# Patient Record
Sex: Male | Born: 1955 | Race: White | Hispanic: No | Smoking: Never smoker
Health system: Southern US, Community
[De-identification: ages and names within clinical notes are randomized; demographics above are authoritative.]

---

## 2010-11-16 ENCOUNTER — Inpatient Hospital Stay (INDEPENDENT_AMBULATORY_CARE_PROVIDER_SITE_OTHER)
Admission: RE | Admit: 2010-11-16 | Discharge: 2010-11-16 | Disposition: A | Discharge status: Home / Self Care | Payer: Self-pay | Source: Ambulatory Visit | Attending: Family Medicine | Admitting: Family Medicine

## 2010-11-16 ENCOUNTER — Ambulatory Visit (INDEPENDENT_AMBULATORY_CARE_PROVIDER_SITE_OTHER): Payer: Self-pay

## 2010-11-16 DIAGNOSIS — R071 Chest pain on breathing: Secondary | ICD-10-CM

## 2015-06-11 ENCOUNTER — Emergency Department (HOSPITAL_COMMUNITY): Payer: BLUE CROSS/BLUE SHIELD

## 2015-06-11 ENCOUNTER — Encounter (HOSPITAL_COMMUNITY): Payer: Self-pay | Admitting: Emergency Medicine

## 2015-06-11 ENCOUNTER — Emergency Department (HOSPITAL_COMMUNITY)
Admission: EM | Admit: 2015-06-11 | Discharge: 2015-06-11 | Disposition: A | Discharge status: Home / Self Care | Payer: BLUE CROSS/BLUE SHIELD | Attending: Emergency Medicine | Admitting: Emergency Medicine

## 2015-06-11 DIAGNOSIS — R1033 Periumbilical pain: Secondary | ICD-10-CM

## 2015-06-11 DIAGNOSIS — R112 Nausea with vomiting, unspecified: Secondary | ICD-10-CM

## 2015-06-11 DIAGNOSIS — R05 Cough: Secondary | ICD-10-CM | POA: Diagnosis present

## 2015-06-11 DIAGNOSIS — Z79899 Other long term (current) drug therapy: Secondary | ICD-10-CM | POA: Diagnosis not present

## 2015-06-11 DIAGNOSIS — J069 Acute upper respiratory infection, unspecified: Secondary | ICD-10-CM

## 2015-06-11 LAB — CBC WITH DIFFERENTIAL/PLATELET
BASOS ABS: 0 10*3/uL (ref 0.0–0.1)
BASOS PCT: 0 %
EOS PCT: 0 %
Eosinophils Absolute: 0 10*3/uL (ref 0.0–0.7)
HCT: 41.3 % (ref 39.0–52.0)
Hemoglobin: 14.3 g/dL (ref 13.0–17.0)
LYMPHS ABS: 0.9 10*3/uL (ref 0.7–4.0)
LYMPHS PCT: 9 %
MCH: 30.4 pg (ref 26.0–34.0)
MCHC: 34.6 g/dL (ref 30.0–36.0)
MCV: 87.9 fL (ref 78.0–100.0)
MONOS PCT: 8 %
Monocytes Absolute: 0.7 10*3/uL (ref 0.1–1.0)
NEUTROS ABS: 8 10*3/uL — AB (ref 1.7–7.7)
NEUTROS PCT: 83 %
PLATELETS: 208 10*3/uL (ref 150–400)
RBC: 4.7 MIL/uL (ref 4.22–5.81)
RDW: 11.8 % (ref 11.5–15.5)
WBC: 9.6 10*3/uL (ref 4.0–10.5)

## 2015-06-11 LAB — URINALYSIS, ROUTINE W REFLEX MICROSCOPIC
Bilirubin Urine: NEGATIVE
GLUCOSE, UA: NEGATIVE mg/dL
HGB URINE DIPSTICK: NEGATIVE
Ketones, ur: 15 mg/dL — AB
LEUKOCYTES UA: NEGATIVE
Nitrite: NEGATIVE
PH: 6 (ref 5.0–8.0)
PROTEIN: NEGATIVE mg/dL
SPECIFIC GRAVITY, URINE: 1.028 (ref 1.005–1.030)

## 2015-06-11 LAB — COMPREHENSIVE METABOLIC PANEL
ALBUMIN: 4.3 g/dL (ref 3.5–5.0)
ALT: 20 U/L (ref 17–63)
AST: 20 U/L (ref 15–41)
Alkaline Phosphatase: 64 U/L (ref 38–126)
Anion gap: 10 (ref 5–15)
BUN: 20 mg/dL (ref 6–20)
CHLORIDE: 100 mmol/L — AB (ref 101–111)
CO2: 28 mmol/L (ref 22–32)
CREATININE: 1.02 mg/dL (ref 0.61–1.24)
Calcium: 9 mg/dL (ref 8.9–10.3)
GFR calc Af Amer: >60 mL/min (ref >60)
GLUCOSE: 121 mg/dL — AB (ref 65–99)
POTASSIUM: 4.2 mmol/L (ref 3.5–5.1)
Sodium: 138 mmol/L (ref 135–145)
Total Bilirubin: 0.9 mg/dL (ref 0.3–1.2)
Total Protein: 7.4 g/dL (ref 6.5–8.1)

## 2015-06-11 LAB — LIPASE, BLOOD: Lipase: 24 U/L (ref 11–51)

## 2015-06-11 MED ORDER — SODIUM CHLORIDE 0.9 % IV SOLN
INTRAVENOUS | Status: DC
  Administered 2015-06-11: 13:00:00 via INTRAVENOUS

## 2015-06-11 MED ORDER — ACETAMINOPHEN 325 MG PO TABS
650.0000 mg | ORAL_TABLET | Freq: Once | ORAL | Status: AC
  Administered 2015-06-11: 650 mg via ORAL
  Filled 2015-06-11: qty 2

## 2015-06-11 MED ORDER — ONDANSETRON 4 MG PO TBDP: 4.0000 mg | ORAL_TABLET | Freq: Three times a day (TID) | ORAL | Status: AC | PRN

## 2015-06-11 MED ORDER — IOPAMIDOL (ISOVUE-300) INJECTION 61%
100.0000 mL | Freq: Once | INTRAVENOUS | Status: AC | PRN
  Administered 2015-06-11: 100 mL via INTRAVENOUS

## 2015-06-11 MED ORDER — IPRATROPIUM BROMIDE 0.03 % NA SOLN
2.0000 | Freq: Four times a day (QID) | NASAL | Status: DC
  Administered 2015-06-11: 2 via NASAL
  Filled 2015-06-11: qty 30

## 2015-06-11 MED ORDER — ONDANSETRON HCL 4 MG/2ML IJ SOLN
4.0000 mg | Freq: Once | INTRAMUSCULAR | Status: AC
  Administered 2015-06-11: 4 mg via INTRAVENOUS
  Filled 2015-06-11: qty 2

## 2015-06-11 NOTE — ED Provider Notes (Signed)
Medical screening examination/treatment/procedure(s) were conducted as a shared visit with non-physician practitioner(s) and myself.  I personally evaluated the patient during the encounter.   EKG Interpretation None      Results for orders placed or performed during the hospital encounter of 06/11/15  CBC with Differential/Platelet  Result Value Ref Range   WBC 9.6 4.0 - 10.5 K/uL   RBC 4.70 4.22 - 5.81 MIL/uL   Hemoglobin 14.3 13.0 - 17.0 g/dL   HCT 40.941.3 81.139.0 - 91.452.0 %   MCV 87.9 78.0 - 100.0 fL   MCH 30.4 26.0 - 34.0 pg   MCHC 34.6 30.0 - 36.0 g/dL   RDW 78.211.8 95.611.5 - 21.315.5 %   Platelets 208 150 - 400 K/uL   Neutrophils Relative % 83 %   Neutro Abs 8.0 (H) 1.7 - 7.7 K/uL   Lymphocytes Relative 9 %   Lymphs Abs 0.9 0.7 - 4.0 K/uL   Monocytes Relative 8 %   Monocytes Absolute 0.7 0.1 - 1.0 K/uL   Eosinophils Relative 0 %   Eosinophils Absolute 0.0 0.0 - 0.7 K/uL   Basophils Relative 0 %   Basophils Absolute 0.0 0.0 - 0.1 K/uL  Comprehensive metabolic panel  Result Value Ref Range   Sodium 138 135 - 145 mmol/L   Potassium 4.2 3.5 - 5.1 mmol/L   Chloride 100 (L) 101 - 111 mmol/L   CO2 28 22 - 32 mmol/L   Glucose, Bld 121 (H) 65 - 99 mg/dL   BUN 20 6 - 20 mg/dL   Creatinine, Ser 0.861.02 0.61 - 1.24 mg/dL   Calcium 9.0 8.9 - 57.810.3 mg/dL   Total Protein 7.4 6.5 - 8.1 g/dL   Albumin 4.3 3.5 - 5.0 g/dL   AST 20 15 - 41 U/L   ALT 20 17 - 63 U/L   Alkaline Phosphatase 64 38 - 126 U/L   Total Bilirubin 0.9 0.3 - 1.2 mg/dL   GFR calc non Af Amer >60 >60 mL/min   GFR calc Af Amer >60 >60 mL/min   Anion gap 10 5 - 15  Lipase, blood  Result Value Ref Range   Lipase 24 11 - 51 U/L  Urinalysis, Routine w reflex microscopic (not at [facility])  Result Value Ref Range   Color, Urine YELLOW YELLOW   APPearance CLEAR CLEAR   Specific Gravity, Urine 1.028 1.005 - 1.030   pH 6.0 5.0 - 8.0   Glucose, UA NEGATIVE NEGATIVE mg/dL   Hgb urine dipstick NEGATIVE NEGATIVE   Bilirubin Urine NEGATIVE  NEGATIVE   Ketones, ur 15 (A) NEGATIVE mg/dL   Protein, ur NEGATIVE NEGATIVE mg/dL   Nitrite NEGATIVE NEGATIVE   Leukocytes, UA NEGATIVE NEGATIVE   Dg Abd Acute W/chest  06/11/2015  CLINICAL DATA:  60 year old male with a history of epigastric pain. EXAM: DG ABDOMEN ACUTE W/ 1V CHEST COMPARISON:  11/16/2010 FINDINGS: Chest: Cardiomediastinal silhouette unchanged in size and contour. No pneumothorax or pleural effusion.  No confluent airspace disease. Similar appearance of coarsened interstitial markings. Abdomen: Gas within stomach, small bowel, colon. Formed stool within the hepatic flexure, descending colon, rectum. No abnormally distended small bowel. No unexpected radiopaque foreign body.  No unexpected calcification. Unremarkable appearance of the renal, hepatic, and splenic silhouettes. No displaced fracture. IMPRESSION: Chest: No radiographic evidence of acute cardiopulmonary disease. Abdomen: Moderate stool burden. Nonobstructive bowel gas pattern. Signed, Yvone NeuJaime S. Loreta AveWagner, DO [facility] Electronically Signed   By: Gilmer MorJaime  Wagner D.O.   On:  06/11/2015 14:11    Patient with periumbilical abdominal pain. Acute onset last evening around midnight. Associated multiple episodes of vomiting. May very well be a viral gastritis. Based on patient's age and has had a history of an umbilical hernia repair in the past recommend CT scan of the abdomen to make sure is no other acute abdominal pathology going on. Patient's urinalysis is negative electrolytes including liver function tests without significant abnormalities. No significant leukocytosis no anemia. Patient's acute abdominal series without any acute findings. If CT of the abdomen is negative patient can be discharged home. Patient abdominal exam [place] mild tenderness in the periumbilical area. Does have bowel sounds.   Vanetta Mulders, MD 06/11/15 [phone]

## 2015-06-11 NOTE — ED Notes (Signed)
Pt c/o headache, cough, and congestion since Thursday. Pt also states he vomited after eating dinner last night. Pt describes productive cough with yellow sputum.

## 2015-06-11 NOTE — ED Notes (Signed)
Pt complaint of generalized aches, nasal congestion, headache, and emesis onset Thursday.

## 2015-06-11 NOTE — Discharge Instructions (Signed)
Your CT scan is normal . You have been given a medication called Atrovent, this is for your nasal congestion, use 1 spray to each nostril 3-4 times daily for the next 3-4 days   Abdominal Pain, Adult Many things can cause belly (abdominal) pain. Most times, the belly pain is not dangerous. Many cases of belly pain can be watched and treated at home. HOME CARE   Do not take medicines that help you go poop (laxatives) unless told to by your doctor.  Only take medicine as told by your doctor.  Eat or drink as told by your doctor. Your doctor will tell you if you should be on a special diet. GET HELP IF:  You do not know what is causing your belly pain.  You have belly pain while you are sick to your stomach (nauseous) or have runny poop (diarrhea).  You have pain while you pee or poop.  Your belly pain wakes you up at night.  You have belly pain that gets worse or better when you eat.  You have belly pain that gets worse when you eat fatty foods.  You have a fever. GET HELP RIGHT AWAY IF:   The pain does not go away within 2 hours.  You keep throwing up (vomiting).  The pain changes and is only in the right or left part of the belly.  You have bloody or tarry looking poop. MAKE SURE YOU:   Understand these instructions.  Will watch your condition.  Will get help right away if you are not doing well or get worse.   This information is not intended to replace advice given to you by your health care provider. Make sure you discuss any questions you have with your health care provider.   Document Released: 08/08/2007 Document Revised: 03/12/2014 Document Reviewed: 10/29/2012 Elsevier Interactive Patient Education [employer]2016 [employer] Inc.

## 2015-06-11 NOTE — ED Provider Notes (Signed)
Patient was signed out to me pending CT scan results which are normal.  Patient has been reevaluated.  He states he is feeling better, is no longer nauseated, but he is still complaining of nasal congestion and postnasal drip.  I will provide him with an Atrovent nasal inhaler for symptom control  Earley FavorGail Cordarrell Sane, NP 06/11/15 1831  Vanetta MuldersScott Zackowski, MD 06/12/15 [id number]

## 2015-06-11 NOTE — ED Provider Notes (Signed)
CSN: [id number]     Arrival date & time 06/11/15  1200 History   First MD Initiated Contact with Patient 06/11/15 1252     Chief Complaint  Patient presents with  . Flu Like Symptoms      (Consider location/radiation/quality/duration/timing/severity/associated sxs/prior Treatment) HPI Comments: Patient presents with three-day history of upper respiratory infection symptoms including nasal congestion, cough, sore throat and headache. Patient returned home from out of town last night and began having abdominal pain located around the umbilicus with associated vomiting. Patient has had persistent vomiting since that time and has been unable to tolerate liquids or solids. No bowel movements or diarrhea. He has passed a small amount of gas. He denies any fevers. History of hernia surgery but no other surgeries or bowel obstructions. No urinary symptoms. No treatments prior to arrival. Onset of symptoms acute. Course is constant. Nothing makes symptoms better or worse.  The history is provided by the spouse and the patient.    History reviewed. No pertinent past medical history. History reviewed. No pertinent past surgical history. No family history on file. Social History  Substance Use Topics  . Smoking status: Never Smoker   . Smokeless tobacco: None  . Alcohol Use: No    Review of Systems  Constitutional: Negative for fever.  HENT: Positive for congestion, rhinorrhea and sore throat.   Eyes: Negative for redness.  Respiratory: Positive for cough. Negative for shortness of breath.   Cardiovascular: Negative for chest pain.  Gastrointestinal: Positive for nausea, vomiting and abdominal pain. Negative for diarrhea and abdominal distention.  Genitourinary: Negative for dysuria.  Musculoskeletal: Negative for myalgias.  Skin: Negative for rash.  Neurological: Positive for headaches.    Allergies  Review of patient's allergies indicates no known allergies.  Home Medications   Prior  to Admission medications   Medication Sig Start Date End Date Taking? Authorizing Provider  ibuprofen (ADVIL) 200 MG tablet Take 400 mg by mouth every 4 (four) hours as needed for moderate pain.   Yes Historical Provider, MD  pseudoephedrine (SUDAFED) 120 MG 12 hr tablet Take 120 mg by mouth 2 (two) times daily as needed for congestion.   Yes Historical Provider, MD  tamsulosin (FLOMAX) 0.4 MG CAPS capsule Take 1 capsule by mouth every evening. 06/09/15  Yes Historical Provider, MD  VENTOLIN HFA 108 (90 Base) MCG/ACT inhaler Inhale 1-2 puffs into the lungs every 4 (four) hours as needed. Shortness of breathe. 05/04/15  Yes Historical Provider, MD   BP 109/66 mmHg  Pulse 63  Temp(Src) 97.8 F (36.6 C) (Oral)  Resp 16  Ht  (1.778 m)  Wt 90.719 kg  BMI 28.70 kg/m2  SpO2 98%   Physical Exam  Constitutional: He appears well-developed and well-nourished. He appears distressed (actively vomiting).  HENT:  Head: Normocephalic and atraumatic.  Mouth/Throat: Oropharynx is clear and moist.  Eyes: Conjunctivae are normal. Right eye exhibits no discharge. Left eye exhibits no discharge.  Neck: Normal range of motion. Neck supple.  Cardiovascular: Normal rate, regular rhythm and normal heart sounds.   Pulmonary/Chest: Effort normal and breath sounds normal. No respiratory distress. He has no wheezes. He has no rales.  Abdominal: Soft. He exhibits no distension. There is tenderness. There is no rebound and no guarding.  Neurological: He is alert.  Skin: Skin is warm and dry.  Psychiatric: He has a normal mood and affect.  Nursing note and vitals reviewed.   ED Course  Procedures (including critical care time) Labs Review Labs  Reviewed  CBC WITH DIFFERENTIAL/PLATELET - Abnormal; Notable for the following:    Neutro Abs 8.0 (*)    All other components within normal limits  COMPREHENSIVE METABOLIC PANEL - Abnormal; Notable for the following:    Chloride 100 (*)    Glucose, Bld 121 (*)     All other components within normal limits  URINALYSIS, ROUTINE W REFLEX MICROSCOPIC (NOT AT [facility]) - Abnormal; Notable for the following:    Ketones, ur 15 (*)    All other components within normal limits  LIPASE, BLOOD    Imaging Review Dg Abd Acute W/chest  06/11/2015  CLINICAL DATA:  60 year old male with a history of epigastric pain. EXAM: DG ABDOMEN ACUTE W/ 1V CHEST COMPARISON:  11/16/2010 FINDINGS: Chest: Cardiomediastinal silhouette unchanged in size and contour. No pneumothorax or pleural effusion.  No confluent airspace disease. Similar appearance of coarsened interstitial markings. Abdomen: Gas within stomach, small bowel, colon. Formed stool within the hepatic flexure, descending colon, rectum. No abnormally distended small bowel. No unexpected radiopaque foreign body.  No unexpected calcification. Unremarkable appearance of the renal, hepatic, and splenic silhouettes. No displaced fracture. IMPRESSION: Chest: No radiographic evidence of acute cardiopulmonary disease. Abdomen: Moderate stool burden. Nonobstructive bowel gas pattern. Signed, Yvone NeuJaime S. Loreta AveWagner, DO [facility] Electronically Signed   By: Gilmer MorJaime  Wagner D.O.   On: 06/11/2015 14:11   I have personally reviewed and evaluated these images and lab results as part of my medical decision-making.  12:53 PM Patient seen and examined. Work-up initiated. Medications ordered.   Vital signs reviewed and are as follows: BP 109/66 mmHg  Pulse 63  Temp(Src) 97.8 F (36.6 C) (Oral)  Resp 16  Ht 5\' 10"  (1.778 m)  Wt 90.719 kg  BMI 28.70 kg/m2  SpO2 98%  3:29 PM Patient felt better after zofran. Discussed with Dr. Deretha EmoryZackowski who has seen. Feel that CT is indicated at this time for further deliniation, r/o SBO. Handoff to oncoming provider at shift change.   4:18 PM Discussed patient with Earley FavorGail Schulz NP who will follow-up on CT results. If neg, can likely be discharged if symptoms  are controlled.   MDM   Final diagnoses:  Periumbilical abdominal pain  Non-intractable vomiting with nausea, vomiting of unspecified type  Upper respiratory tract infection   Pending completion of work-up.     Renne CriglerJoshua Khloie Hamada, PA-C 06/11/15 1620

## 2015-09-16 ENCOUNTER — Telehealth: Payer: Self-pay | Admitting: *Deleted

## 2015-09-16 NOTE — Telephone Encounter (Signed)
A user error has taken place.

## 2017-09-30 IMAGING — CT CT ABD-PELV W/ CM
2 of 5 series · 16 of 46 positions shown, 18 images · IV contrast (iopamidol)
Comparison: None.

CLINICAL DATA: Abdominal pain around umbilicus.  Vomiting.

EXAM:
CT ABDOMEN AND PELVIS WITH CONTRAST
TECHNIQUE: Multidetector CT imaging of the abdomen and pelvis was performed
using the standard protocol following bolus administration of
intravenous contrast.
CONTRAST:  100mL [id number] IOPAMIDOL ([id number]) INJECTION 61%

[Series 2: abd/pel with · axial · 0.87mm/px · z∈[-367,-17]mm · 13 of 82 slices shown, 15 images]
[im 6/82  soft-tissue]
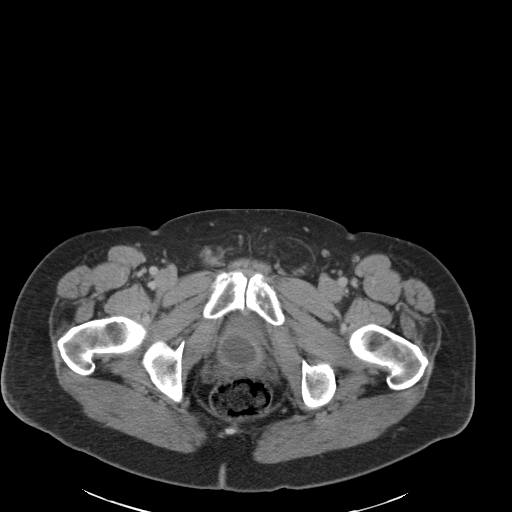
[im 6/82  bone]
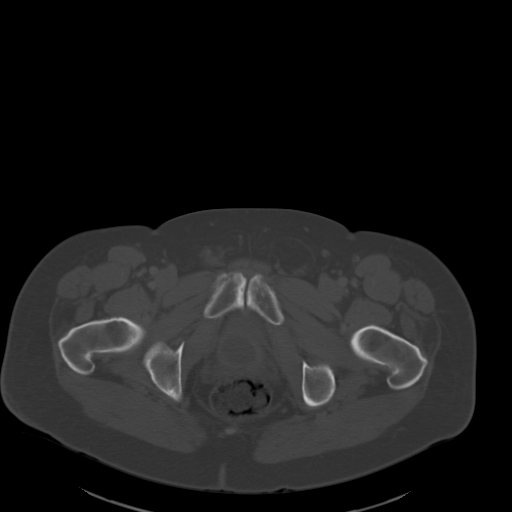
[im 11/82  soft-tissue]
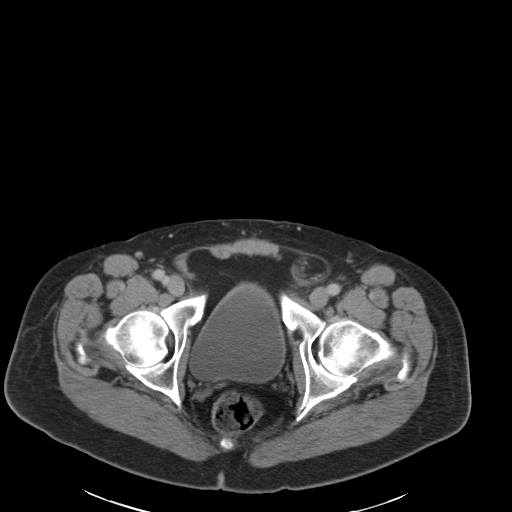
[im 16/82  soft-tissue]
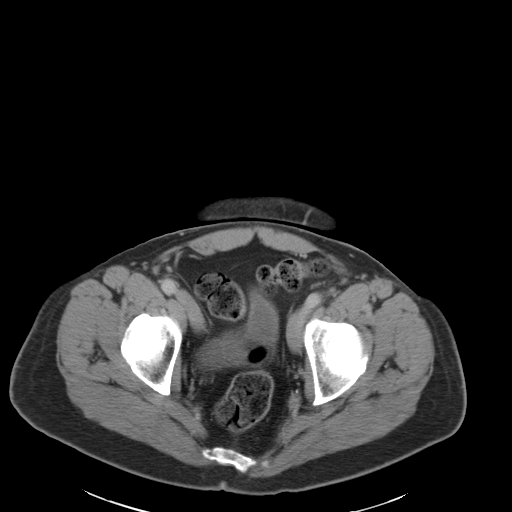
[im 26/82  soft-tissue]
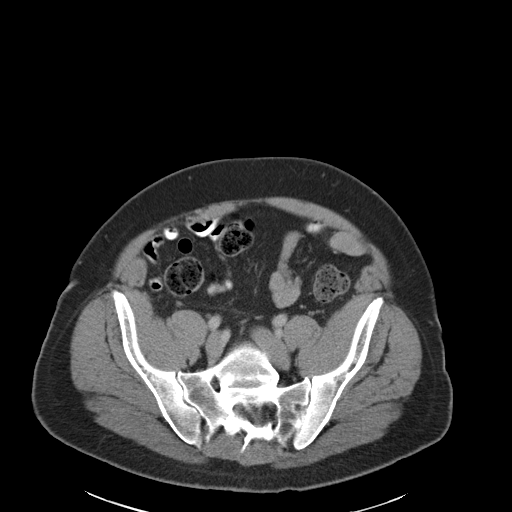
[im 31/82  soft-tissue]
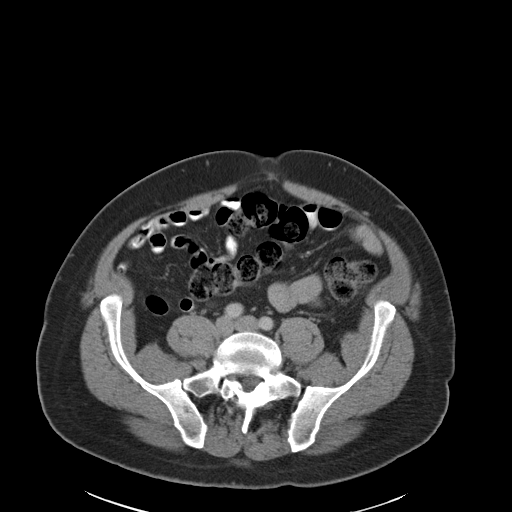
[im 36/82  soft-tissue]
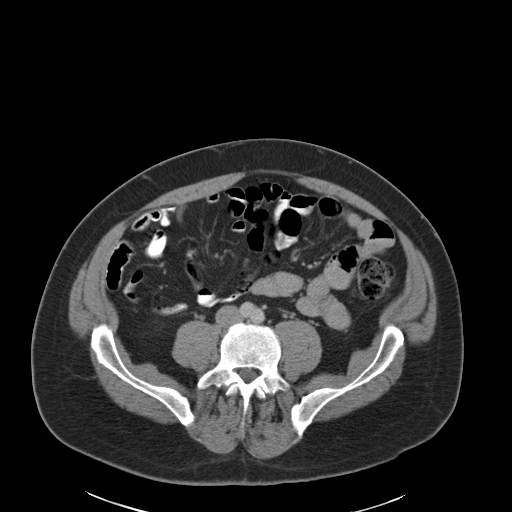
[im 41/82  soft-tissue]
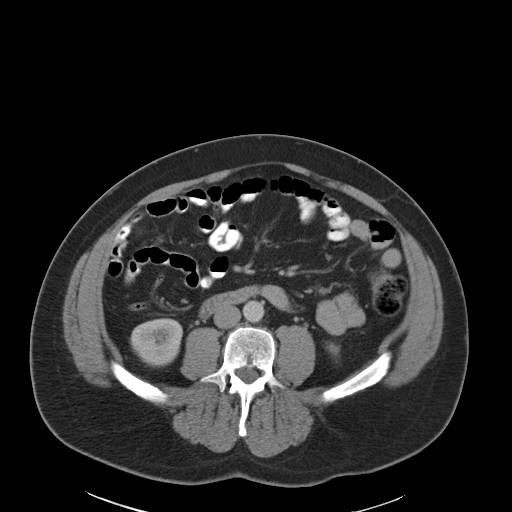
[im 46/82  soft-tissue]
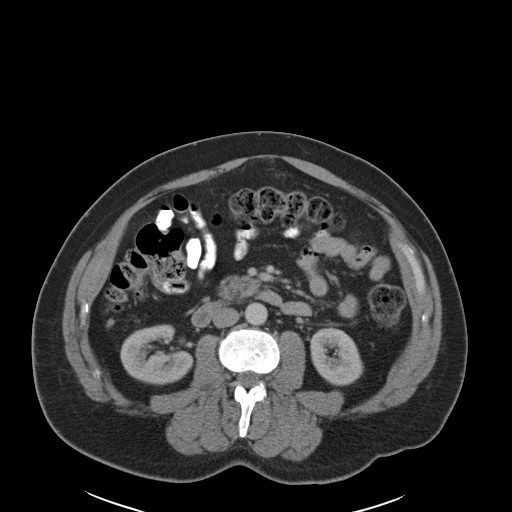
[im 51/82  soft-tissue]
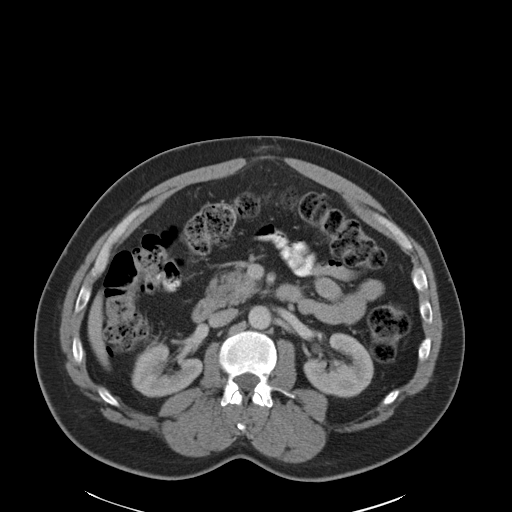
[im 51/82  bone]
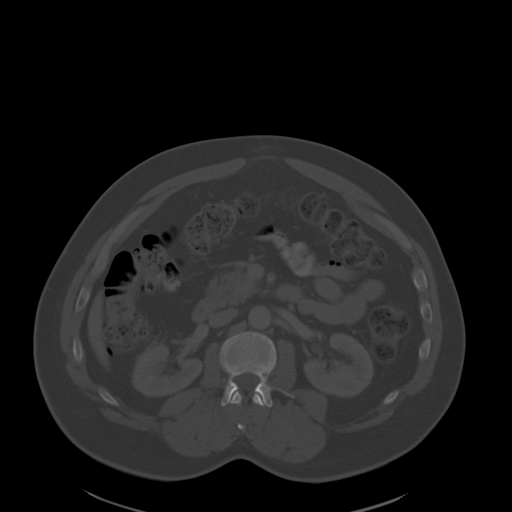
[im 56/82  soft-tissue]
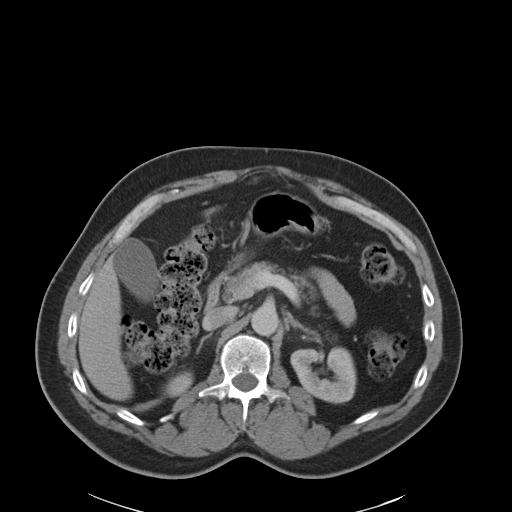
[im 66/82  soft-tissue]
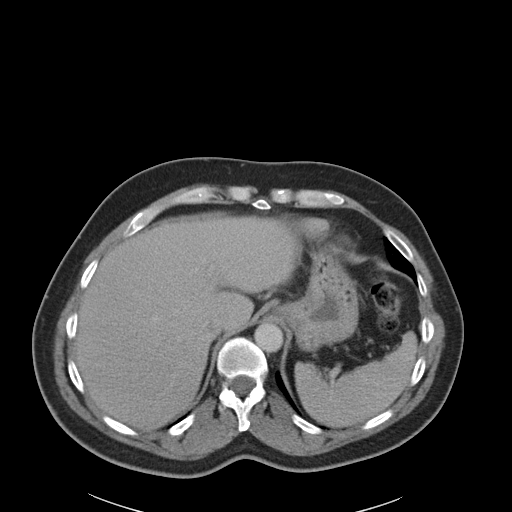
[im 71/82  soft-tissue]
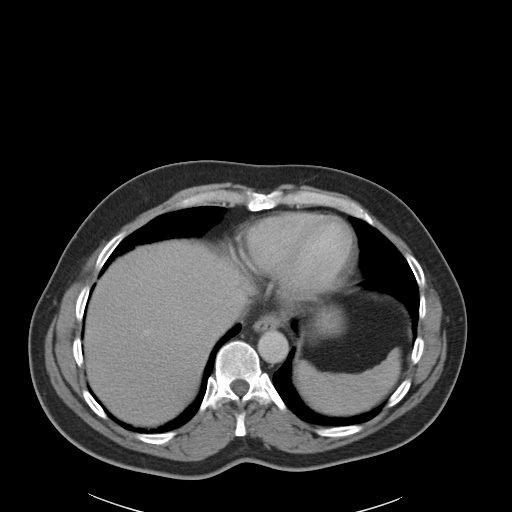
[im 76/82  soft-tissue]
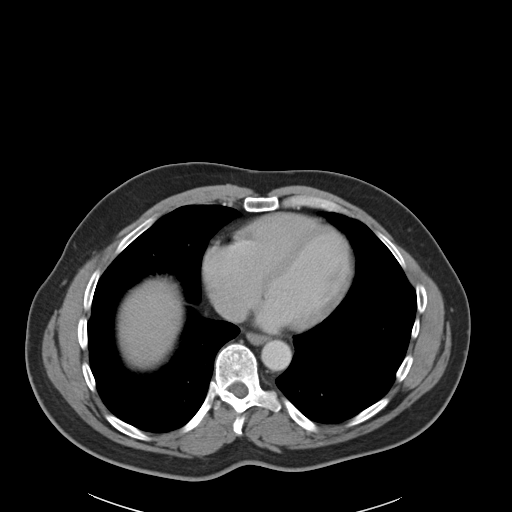

[Series 6: coronal a/|p · coronal · 0.81mm/px · 3 of 175 slices shown]
[im 59/175  soft-tissue]
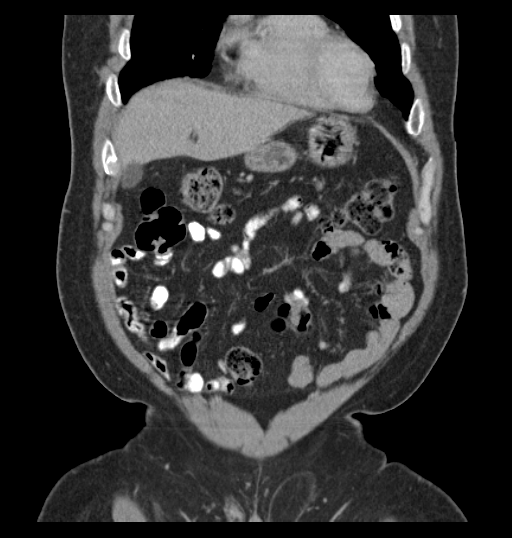
[im 78/175  soft-tissue]
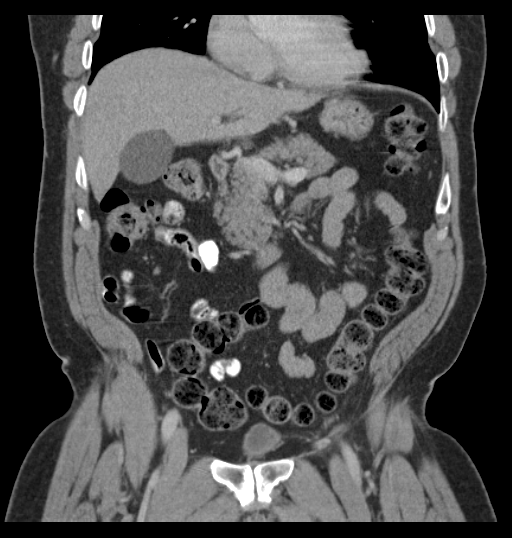
[im 97/175  soft-tissue]
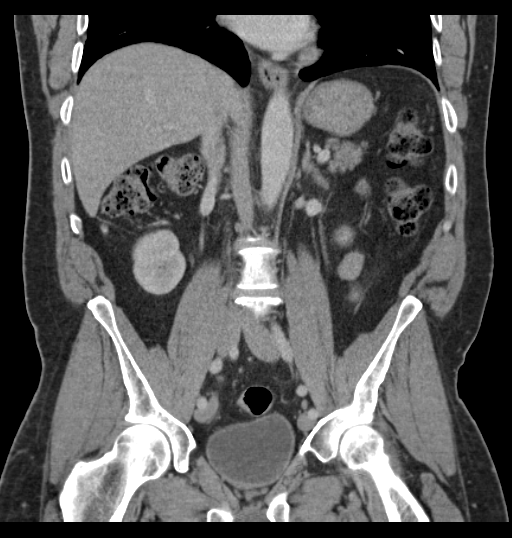

[16 of 46 positions shown; findings below may reference images not displayed]

FINDINGS: Normal lung bases.

No free air or free fluid. There is a cyst adjacent to the
gallbladder fossa. The liver, gallbladder, and portal vein are
otherwise normal. The spleen, adrenal glands, pancreas, and kidneys
are normal. The abdominal aorta and branching vessels are
unremarkable. No adenopathy. There is a small periumbilical hernia,
just superior to the umbilicus as seen on sagittal image 111. This
hernia contains fat. There appears to be a tiny umbilical hernia as
seen on sagittal image 109, also containing fat. A another small
ventral hernia is seen more superiorly on axial image 31 and
sagittal image 105, containing fat, and perhaps a tiny amount of
omentum. There is no acute fat stranding in this region to suggest
this is an acute finding. There is a fat containing left inguinal
hernia no other ventral hernias are identified. The colon is normal
in appearance with no wall thickening or pericolonic stranding. The
appendix is normal in appearance with no evidence appendicitis. The
stomach and small bowel are normal.

Evaluation of pelvis demonstrates a TURP defect extending from the
base of the bladder into the prostate. The bladder is otherwise
normal. No adenopathy or mass. A left inguinal hernia is seen,
containing fat.

No bony abnormalities.
IMPRESSION: 1. No cause for the patient's symptoms identified.
2. Three small ventral hernias as described above. Fat containing
left inguinal hernia.

## 2017-09-30 IMAGING — CR DG ABDOMEN ACUTE W/ 1V CHEST
4 series · 4 of 4 positions shown · non-contrast
Comparison: 11/16/2010

CLINICAL DATA: 60-year-old male with a history of epigastric pain.

EXAM:
DG ABDOMEN ACUTE W/ 1V CHEST

[w chest pa]
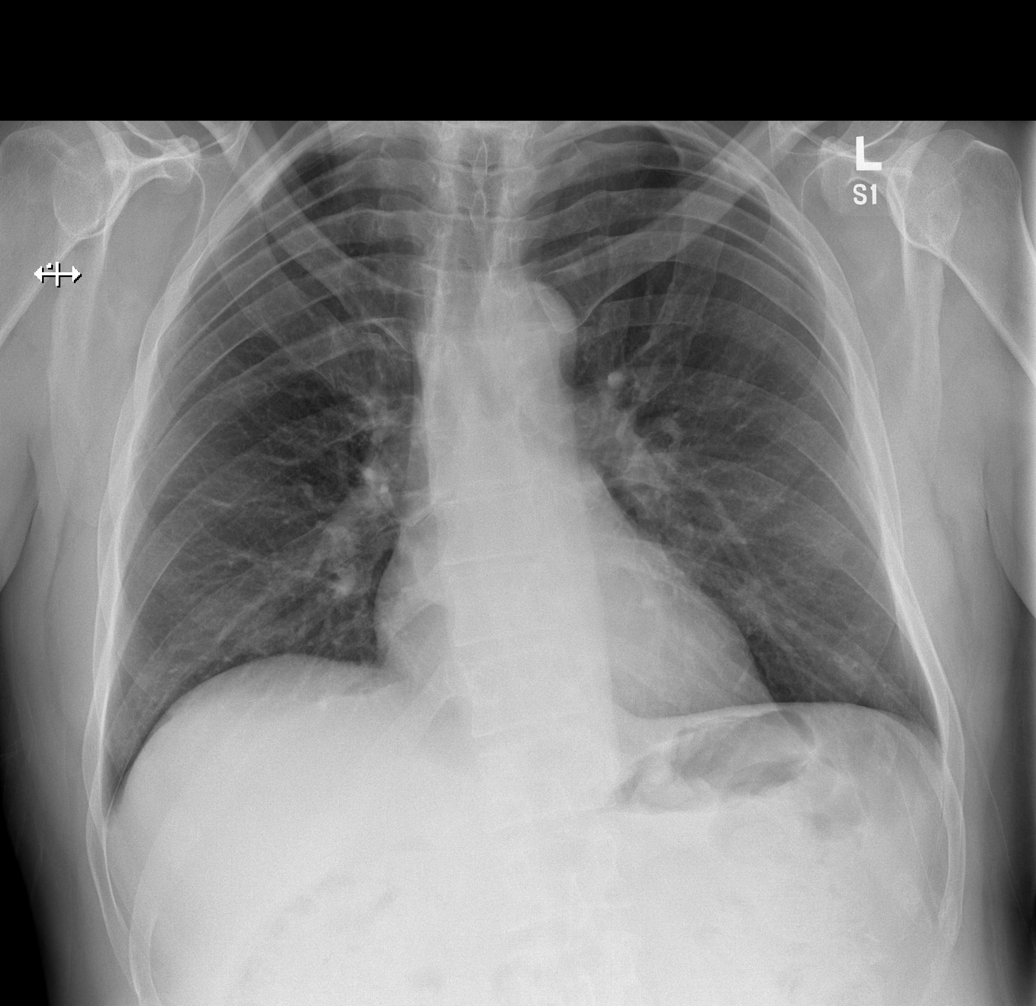

[w abdomen upright]
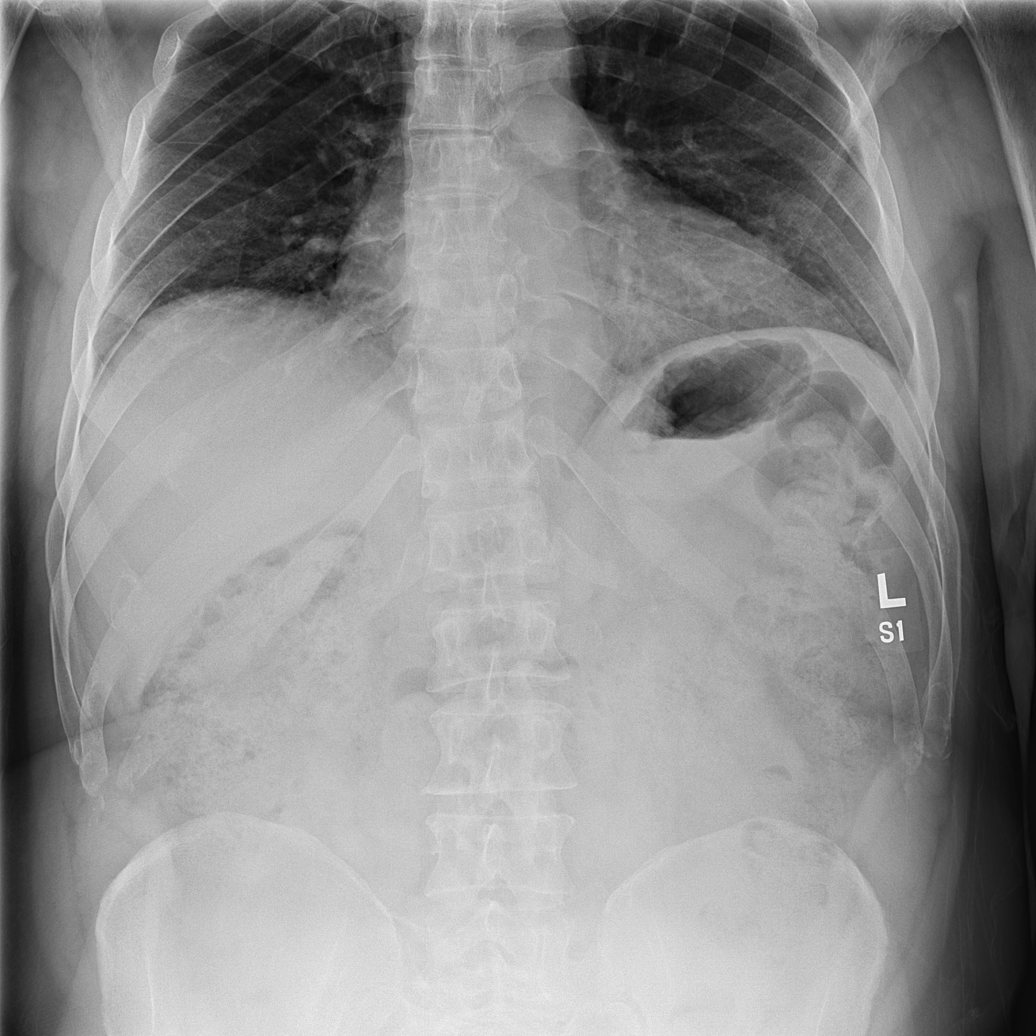

[t abdomen supine (1 of 2)]
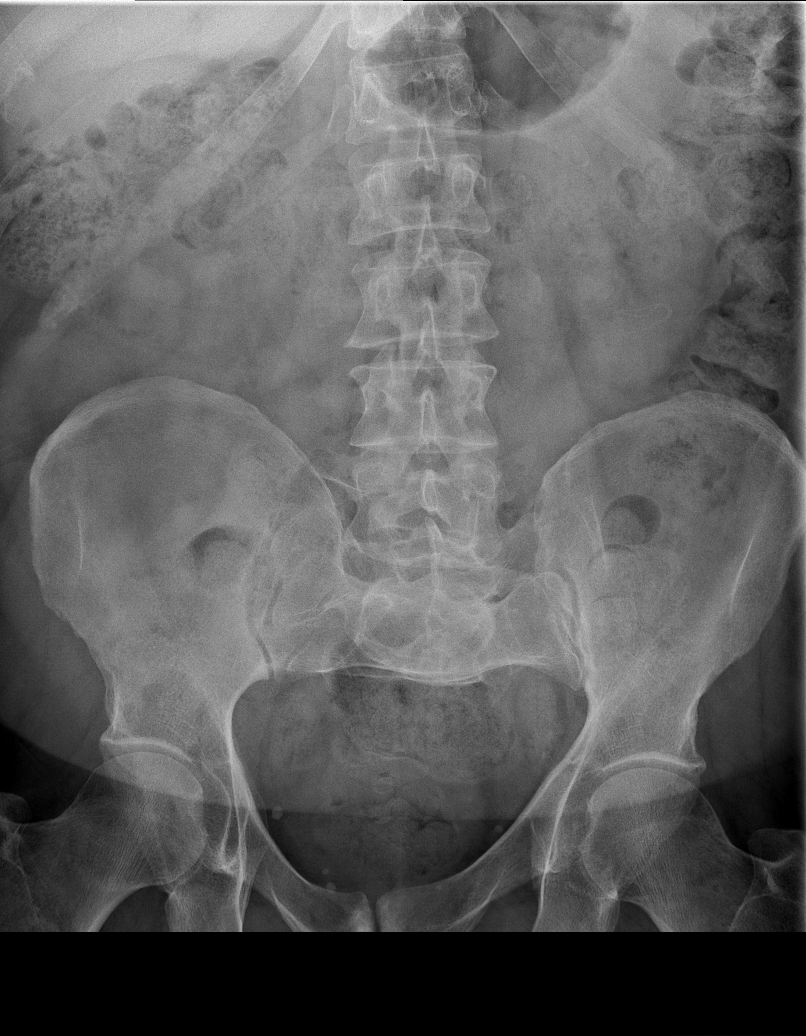

[t abdomen supine (2 of 2)]
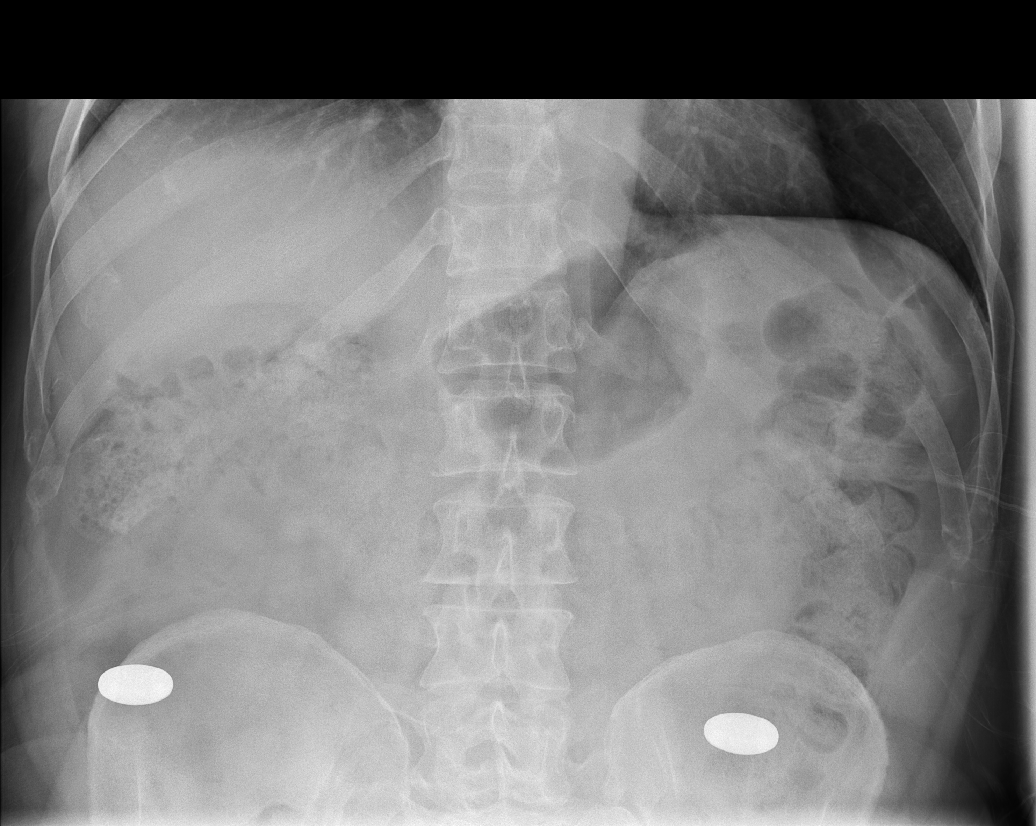

[4 of 4 positions shown; findings below may reference images not displayed]

FINDINGS: Chest:

Cardiomediastinal silhouette unchanged in size and contour.

No pneumothorax or pleural effusion.  No confluent airspace disease.

Similar appearance of coarsened interstitial markings.

Abdomen:

Gas within stomach, small bowel, colon. Formed stool within the
hepatic flexure, descending colon, rectum. No abnormally distended
small bowel.

No unexpected radiopaque foreign body.  No unexpected calcification.

Unremarkable appearance of the renal, hepatic, and splenic
silhouettes.

No displaced fracture.
IMPRESSION: Chest:

No radiographic evidence of acute cardiopulmonary disease.

Abdomen:

Moderate stool burden.

Nonobstructive bowel gas pattern.
